# Patient Record
Sex: Female | Born: 1945 | Race: White | Hispanic: No | Smoking: Never smoker
Health system: Southern US, Community
[De-identification: ages and names within clinical notes are randomized; demographics above are authoritative.]

## PROBLEM LIST (undated history)

## (undated) DIAGNOSIS — E7800 Pure hypercholesterolemia, unspecified: Secondary | ICD-10-CM

## (undated) DIAGNOSIS — M1990 Unspecified osteoarthritis, unspecified site: Secondary | ICD-10-CM

## (undated) DIAGNOSIS — I10 Essential (primary) hypertension: Secondary | ICD-10-CM

## (undated) HISTORY — DX: Unspecified osteoarthritis, unspecified site: M19.90

---

## 1999-01-17 ENCOUNTER — Ambulatory Visit (HOSPITAL_COMMUNITY): Admission: RE | Admit: 1999-01-17 | Discharge: 1999-01-17 | Payer: Self-pay | Admitting: *Deleted

## 1999-01-17 ENCOUNTER — Ambulatory Visit (HOSPITAL_COMMUNITY): Admission: RE | Admit: 1999-01-17 | Discharge: 1999-01-17 | Payer: Self-pay

## 2011-04-27 ENCOUNTER — Encounter: Payer: Self-pay | Admitting: *Deleted

## 2011-04-27 ENCOUNTER — Emergency Department (HOSPITAL_BASED_OUTPATIENT_CLINIC_OR_DEPARTMENT_OTHER)
Admission: EM | Admit: 2011-04-27 | Discharge: 2011-04-27 | Disposition: A | Discharge status: Home / Self Care | Payer: Medicare Other | Attending: Emergency Medicine | Admitting: Emergency Medicine

## 2011-04-27 DIAGNOSIS — E7800 Pure hypercholesterolemia, unspecified: Secondary | ICD-10-CM | POA: Insufficient documentation

## 2011-04-27 DIAGNOSIS — I10 Essential (primary) hypertension: Secondary | ICD-10-CM | POA: Insufficient documentation

## 2011-04-27 DIAGNOSIS — E119 Type 2 diabetes mellitus without complications: Secondary | ICD-10-CM | POA: Insufficient documentation

## 2011-04-27 DIAGNOSIS — K0889 Other specified disorders of teeth and supporting structures: Secondary | ICD-10-CM

## 2011-04-27 DIAGNOSIS — K089 Disorder of teeth and supporting structures, unspecified: Secondary | ICD-10-CM | POA: Insufficient documentation

## 2011-04-27 HISTORY — DX: Essential (primary) hypertension: I10

## 2011-04-27 HISTORY — DX: Pure hypercholesterolemia, unspecified: E78.00

## 2011-04-27 MED ORDER — PENICILLIN V POTASSIUM 250 MG PO TABS: 250.0000 mg | ORAL_TABLET | Freq: Four times a day (QID) | ORAL | Status: AC

## 2011-04-27 NOTE — ED Provider Notes (Signed)
History     CSN: [id number] Arrival date & time: 04/27/2011  7:51 PM   Chief Complaint  Patient presents with  . Dental Pain     (Include location/radiation/quality/duration/timing/severity/associated sxs/prior treatment) HPI Comments: Pt states that she broke the tooth about 1 year ago and now she has been having worsening pain over the last 2 months  Patient is a 65 y.o. female presenting with tooth pain. The history is provided by the patient. No language interpreter was used.  Dental PainThe primary symptoms include mouth pain and dental injury. The symptoms began more than 1 month ago. The symptoms are worsening. The symptoms are recurrent.     Past Medical History  Diagnosis Date  . Diabetes mellitus   . Hypertension   . Hypercholesteremia      History reviewed. No pertinent past surgical history.  History reviewed. No pertinent family history.  History  Substance Use Topics  . Smoking status: Never Smoker   . Smokeless tobacco: Not on file  . Alcohol Use: No    OB History    Grav Para Term Preterm Abortions TAB SAB Ect Mult Living                  Review of Systems  All other systems reviewed and are negative.    Allergies  Review of patient's allergies indicates not on file.  Home Medications  No current outpatient prescriptions on file.  Physical Exam    BP 168/79  Pulse 62  Temp(Src) 98.7 F (37.1 C) (Oral)  Resp 20  Ht 5\' 7"  (1.702 m)  Wt 214 lb (97.07 kg)  BMI 33.52 kg/m2  SpO2 98%  Physical Exam  Nursing note and vitals reviewed. Constitutional: She appears well-developed and well-nourished.  HENT:  Head: Normocephalic.  Right Ear: External ear normal.  Left Ear: External ear normal.  Mouth/Throat: Oropharynx is clear and moist.    Cardiovascular: Normal rate and regular rhythm.     ED Course  Procedures  No results found for this or any previous visit. No results found.   No diagnosis found.   MDM Will treat for  dental infection       Teressa Lower, NP 04/27/11 2021  Medical screening examination/treatment/procedure(s) were performed by non-physician practitioner and as supervising physician I was immediately available for consultation/collaboration.  Olivia Mackie, MD 04/28/11 [phone]

## 2011-04-27 NOTE — ED Notes (Signed)
Pt describes dental pain (left lower) x 2 months.

## 2013-06-18 ENCOUNTER — Emergency Department (HOSPITAL_BASED_OUTPATIENT_CLINIC_OR_DEPARTMENT_OTHER): Payer: Medicare Other

## 2013-06-18 ENCOUNTER — Encounter (HOSPITAL_BASED_OUTPATIENT_CLINIC_OR_DEPARTMENT_OTHER): Payer: Self-pay | Admitting: Emergency Medicine

## 2013-06-18 ENCOUNTER — Emergency Department (HOSPITAL_BASED_OUTPATIENT_CLINIC_OR_DEPARTMENT_OTHER)
Admission: EM | Admit: 2013-06-18 | Discharge: 2013-06-18 | Disposition: A | Discharge status: Home / Self Care | Payer: Medicare Other | Attending: Emergency Medicine | Admitting: Emergency Medicine

## 2013-06-18 DIAGNOSIS — E119 Type 2 diabetes mellitus without complications: Secondary | ICD-10-CM | POA: Insufficient documentation

## 2013-06-18 DIAGNOSIS — I10 Essential (primary) hypertension: Secondary | ICD-10-CM | POA: Insufficient documentation

## 2013-06-18 DIAGNOSIS — S52599A Other fractures of lower end of unspecified radius, initial encounter for closed fracture: Secondary | ICD-10-CM | POA: Insufficient documentation

## 2013-06-18 DIAGNOSIS — Z79899 Other long term (current) drug therapy: Secondary | ICD-10-CM | POA: Insufficient documentation

## 2013-06-18 DIAGNOSIS — Y939 Activity, unspecified: Secondary | ICD-10-CM | POA: Insufficient documentation

## 2013-06-18 DIAGNOSIS — W010XXA Fall on same level from slipping, tripping and stumbling without subsequent striking against object, initial encounter: Secondary | ICD-10-CM | POA: Insufficient documentation

## 2013-06-18 DIAGNOSIS — Y929 Unspecified place or not applicable: Secondary | ICD-10-CM | POA: Insufficient documentation

## 2013-06-18 DIAGNOSIS — E7800 Pure hypercholesterolemia, unspecified: Secondary | ICD-10-CM | POA: Insufficient documentation

## 2013-06-18 DIAGNOSIS — Z794 Long term (current) use of insulin: Secondary | ICD-10-CM | POA: Insufficient documentation

## 2013-06-18 DIAGNOSIS — S52502A Unspecified fracture of the lower end of left radius, initial encounter for closed fracture: Secondary | ICD-10-CM

## 2013-06-18 NOTE — ED Provider Notes (Signed)
CSN: [id number]     Arrival date & time 06/18/13  1227 History   First MD Initiated Contact with Patient 06/18/13 1359     Chief Complaint  Patient presents with  . Fall   (Consider location/radiation/quality/duration/timing/severity/associated sxs/prior Treatment) HPI Comments: Patient is a 67 year old female who presents to the ED with left wrist and hand pain that started yesterday after a FOOSH. Patient reports a mechanical fall where she tripped and braced her fall with her left hand. Patient reports sudden onset of throbbing pain that does not radiate. Movement of the hand and wrist makes the pain worse. No alleviating factors. Patient applied ice to the injury and took ibuprofen which provided minimal pain relief. Patient denies any head injury or LOC. Patient denies any other injury.   Patient is a 67 y.o. female presenting with fall.  Fall Associated symptoms include arthralgias and joint swelling.    Past Medical History  Diagnosis Date  . Diabetes mellitus   . Hypertension   . Hypercholesteremia    No past surgical history on file. No family history on file. History  Substance Use Topics  . Smoking status: Never Smoker   . Smokeless tobacco: Not on file  . Alcohol Use: No   OB History   Grav Para Term Preterm Abortions TAB SAB Ect Mult Living                 Review of Systems  Musculoskeletal: Positive for arthralgias and joint swelling.  All other systems reviewed and are negative.    Allergies  Review of patient's allergies indicates no known allergies.  Home Medications   Current Outpatient Rx  Name  Route  Sig  Dispense  Refill  . amLODipine (NORVASC) 10 MG tablet   Oral   Take 10 mg by mouth daily.           Marland Kitchen gabapentin (NEURONTIN) 100 MG capsule   Oral   Take 100 mg by mouth 3 (three) times daily.           . insulin lispro protamine-insulin lispro (HUMALOG 50/50) (50-50) 100 UNIT/ML SUSP   Subcutaneous   Inject into the skin 2 (two)  times daily before a meal.           . metoprolol (TOPROL-XL) 100 MG 24 hr tablet   Oral   Take 100 mg by mouth daily.           . rosuvastatin (CRESTOR) 10 MG tablet   Oral   Take 10 mg by mouth daily.           . sitaGLIPtin (JANUVIA) 100 MG tablet   Oral   Take 100 mg by mouth daily.            BP 133/66  Pulse 71  Temp(Src) 98.8 F (37.1 C) (Oral)  Resp 18  Ht 5\' 6"  (1.676 m)  Wt 212 lb (96.163 kg)  BMI 34.23 kg/m2  SpO2 100% Physical Exam  Nursing note and vitals reviewed. Constitutional: She is oriented to person, place, and time. She appears well-developed and well-nourished. No distress.  HENT:  Head: Normocephalic and atraumatic.  Eyes: Conjunctivae are normal.  Neck: Normal range of motion.  Cardiovascular: Normal rate, regular rhythm and intact distal pulses.  Exam reveals no gallop and no friction rub.   No murmur heard. Sufficient capillary refill of left hand phalanges.   Pulmonary/Chest: Effort normal and breath sounds normal. She has no wheezes. She has no rales.  She exhibits no tenderness.  Musculoskeletal: Normal range of motion.  Generalized edema of left wrist and hand. Tenderness to palpation of left snuff box. Mild tenderness to dorsal left wrist. No obvious deformity. ROM of left hand and wrist limited due to edema and pain.   Neurological: She is alert and oriented to person, place, and time. Coordination normal.  Speech is goal-oriented. Moves limbs without ataxia.   Skin: Skin is warm and dry.  Psychiatric: She has a normal mood and affect. Her behavior is normal.    ED Course  Procedures (including critical care time)  SPLINT APPLICATION Date/Time: 12/17/2012 3:38 PM Authorized by: Emilia Beck Consent: Verbal consent obtained. Risks and benefits: risks, benefits and alternatives were discussed Consent given by: patient Splint applied by: orthopedic technician Location details: left wrist Splint type: thumb spica Supplies  used: fiberglass Post-procedure: The splinted body part was neurovascularly unchanged following the procedure. Patient tolerance: Patient tolerated the procedure well with no immediate complications.   Labs Review Labs Reviewed - No data to display Imaging Review Dg Wrist Complete Left  06/18/2013   CLINICAL DATA:  Patient fell yesterday injuring her left hand and left wrist. Posterior pain with swelling.  EXAM: LEFT WRIST - COMPLETE 3+ VIEW; LEFT HAND - COMPLETE 3+ VIEW  COMPARISON:  None.  FINDINGS: Left hand: The left hand demonstrates no fracture or dislocation. There is severe osteopenia. There are degenerative changes throughout the DIP joints. There is no soft tissue swelling.  Left wrist: There is a subtle lucency along the volar aspect of the distal radial metaphysis which may represent a Mach line versus a nondisplaced fracture. There is no other fracture or dislocation. There are mild degenerative changes of the triscaphe joint. There is severe osteopenia. There is soft tissue swelling around the left wrist.  IMPRESSION: 1. There is a subtle lucency along the volar aspect of the distal radial metaphysis which may represent a Mach line versus a nondisplaced fracture. Correlate with point tenderness.  2.  No acute osseous injury of the left hand.  If there is clinical concern regarding a radiographically occult fracture, snuff box tenderness or ligamentous injury, then further assessment with MRI is recommended.   Electronically Signed   By: Elige Ko   On: 06/18/2013 13:18   Dg Hand Complete Left  06/18/2013   CLINICAL DATA:  Patient fell yesterday injuring her left hand and left wrist. Posterior pain with swelling.  EXAM: LEFT WRIST - COMPLETE 3+ VIEW; LEFT HAND - COMPLETE 3+ VIEW  COMPARISON:  None.  FINDINGS: Left hand: The left hand demonstrates no fracture or dislocation. There is severe osteopenia. There are degenerative changes throughout the DIP joints. There is no soft tissue  swelling.  Left wrist: There is a subtle lucency along the volar aspect of the distal radial metaphysis which may represent a Mach line versus a nondisplaced fracture. There is no other fracture or dislocation. There are mild degenerative changes of the triscaphe joint. There is severe osteopenia. There is soft tissue swelling around the left wrist.  IMPRESSION: 1. There is a subtle lucency along the volar aspect of the distal radial metaphysis which may represent a Mach line versus a nondisplaced fracture. Correlate with point tenderness.  2.  No acute osseous injury of the left hand.  If there is clinical concern regarding a radiographically occult fracture, snuff box tenderness or ligamentous injury, then further assessment with MRI is recommended.   Electronically Signed   By: Elige Ko  On: 06/18/2013 13:18    EKG Interpretation   None       MDM   1. Distal radial fracture, left, closed, initial encounter     2:13 PM Xray shows possible nondisplaced fracture of distal radius. A fracture correlates clinically so patient will have a left wrist splint and be instructed to follow up with a Hydrographic surveyor. No neurovascular compromise. Patient has snuff box tenderness on exam indicating possible occult scaphoid fracture. Vitals stable and patient afebrile. Patient declines pain medication at this time.    Emilia Beck, [place] 06/18/13 2338

## 2013-06-18 NOTE — ED Notes (Signed)
Pt lost her footing and fell outside yesterday.  Pt injured left hand/wrist.  Noted swelling to same.

## 2013-06-20 NOTE — ED Provider Notes (Signed)
EKG Interpretation   None        Derwood Kaplan, MD 06/20/13 2311

## 2014-04-10 ENCOUNTER — Non-Acute Institutional Stay (SKILLED_NURSING_FACILITY): Payer: Medicare HMO | Admitting: Adult Health

## 2014-04-10 ENCOUNTER — Encounter: Payer: Self-pay | Admitting: Adult Health

## 2014-04-10 DIAGNOSIS — G589 Mononeuropathy, unspecified: Secondary | ICD-10-CM

## 2014-04-10 DIAGNOSIS — M1710 Unilateral primary osteoarthritis, unspecified knee: Secondary | ICD-10-CM

## 2014-04-10 DIAGNOSIS — I10 Essential (primary) hypertension: Secondary | ICD-10-CM | POA: Insufficient documentation

## 2014-04-10 DIAGNOSIS — K219 Gastro-esophageal reflux disease without esophagitis: Secondary | ICD-10-CM | POA: Insufficient documentation

## 2014-04-10 DIAGNOSIS — IMO0001 Reserved for inherently not codable concepts without codable children: Secondary | ICD-10-CM | POA: Insufficient documentation

## 2014-04-10 DIAGNOSIS — E785 Hyperlipidemia, unspecified: Secondary | ICD-10-CM

## 2014-04-10 DIAGNOSIS — M1711 Unilateral primary osteoarthritis, right knee: Secondary | ICD-10-CM | POA: Insufficient documentation

## 2014-04-10 DIAGNOSIS — G629 Polyneuropathy, unspecified: Secondary | ICD-10-CM

## 2014-04-10 DIAGNOSIS — E1165 Type 2 diabetes mellitus with hyperglycemia: Secondary | ICD-10-CM

## 2014-04-10 NOTE — Progress Notes (Signed)
Patient ID: Anna Griffith, female   DOB: 04-Aug-1946, 68 y.o.   MRN: [id number]               PROGRESS NOTE  DATE: 04/10/2014  FACILITY: Nursing Home Location: [facility]  LEVEL OF CARE: SNF (31)  Acute Visit  CHIEF COMPLAINT:  Follow-up Hospitalization  HISTORY OF PRESENT ILLNESS: This is a 68 year old female who has been admitted to [facility] on 04/07/14 from [facility] with Osteoarthritis S/P right total knee arthroplasty. She has been admitted for a short-term rehabilitation.   REASSESSMENT OF ONGOING PROBLEM(S):  DM:pt's DM  Is unstable.  Pt denies polyuria, polydipsia, polyphagia, changes in vision or hypoglycemic episodes.  CBG logs: 295, 279, 266, 243, 320  HTN: Pt 's HTN remains stable.  Denies CP, sob, DOE, headaches, dizziness or visual disturbances.  No complications from the medications currently being used.  Last BP : 126/58  GERD: pt's GERD is stable.  Denies ongoing heartburn, abd. Pain, nausea or vomiting.  Currently on a PPI & tolerates it without any adverse reactions.  PAST MEDICAL HISTORY : Reviewed.  No changes/see problem list  CURRENT MEDICATIONS: Reviewed per MAR/see medication list  REVIEW OF SYSTEMS:  GENERAL: no change in appetite, no fatigue, no weight changes, no fever, chills or weakness RESPIRATORY: no cough, SOB, DOE, wheezing, hemoptysis CARDIAC: no chest pain, or palpitations, +edema GI: no abdominal pain, diarrhea, constipation, heart burn, nausea or vomiting  PHYSICAL EXAMINATION  GENERAL: no acute distress, obese body habitus EYES: conjunctivae normal, sclerae normal, normal eye lids NECK: supple, trachea midline, no neck masses, no thyroid tenderness, no thyromegaly LYMPHATICS: no LAN in the neck, no supraclavicular LAN RESPIRATORY: breathing is even & unlabored, BS CTAB CARDIAC: RRR, no murmur,no extra heart sounds, RLE edema  2+ GI: abdomen soft, normal BS, no masses, no tenderness, no  hepatomegaly, no splenomegaly EXTREMITIES:  Able to move all 4 extremities with limited ROM on RLE due to surgery PSYCHIATRIC: the patient is alert & oriented to person, affect & behavior appropriate  LABS/RADIOLOGY: 04/05/14  sodium 145 potassium 4.3 glucose 93 BUN 10 creatinine 0.85 calcium 8.0 anion gap 7.0 WBC 6.4 hemoglobin 9.5 hematocrit 30.2 MCV 84.8   ASSESSMENT/PLAN:  Osteoarthritis is status post right total knee arthroplasty - for rehabilitation Diabetes mellitus, type II - continue metformin and start Humalog 75/25 38 units subcutaneous every morning and 32 units subcutaneous every afternoon GERD - stable; continue ranitidine Hyperlipidemia - continue Crestor Hypertension - well controlled; continue amlodipine, hydralazine, losartan and metoprolol Neuropathy - continue gabapentin and Lyrica    CPT CODE: 57846  Ella Bodo - NP [facility] [phone]

## 2014-04-11 ENCOUNTER — Non-Acute Institutional Stay (SKILLED_NURSING_FACILITY): Payer: Medicare HMO | Admitting: Internal Medicine

## 2014-04-11 DIAGNOSIS — G589 Mononeuropathy, unspecified: Secondary | ICD-10-CM

## 2014-04-11 DIAGNOSIS — I10 Essential (primary) hypertension: Secondary | ICD-10-CM

## 2014-04-11 DIAGNOSIS — E1149 Type 2 diabetes mellitus with other diabetic neurological complication: Secondary | ICD-10-CM

## 2014-04-11 DIAGNOSIS — M1710 Unilateral primary osteoarthritis, unspecified knee: Secondary | ICD-10-CM

## 2014-04-11 DIAGNOSIS — G629 Polyneuropathy, unspecified: Secondary | ICD-10-CM

## 2014-04-11 DIAGNOSIS — M1711 Unilateral primary osteoarthritis, right knee: Secondary | ICD-10-CM

## 2014-04-11 NOTE — Progress Notes (Signed)
        HISTORY & PHYSICAL  DATE: 04/11/2014   FACILITY: [facility]  LEVEL OF CARE: SNF (31)  ALLERGIES:  No Known Allergies  CHIEF COMPLAINT:  Manage right knee osteoarthritis, diabetes mellitus and hypertension  HISTORY OF PRESENT ILLNESS: Patient is a 68 year old African American female.  KNEE OSTEOARTHRITIS: Patient had a history of pain and functional disability in the knee due to end-stage osteoarthritis and has failed nonsurgical conservative treatments. Patient had worsening of pain with activity and weight bearing, pain that interfered with activities of daily living & pain with passive range of motion. Therefore patient underwent total knee arthroplasty and tolerated the procedure well. Patient is admitted to this facility for sort short-term rehabilitation. Patient denies knee pain.  HTN: Pt 's HTN remains stable.  Denies CP, sob, DOE, pedal edema, headaches, dizziness or visual disturbances.  No complications from the medications currently being used.  Last BP : 140/59.  DM:pt's DM remains stable.  Pt denies polyuria, polydipsia, polyphagia, changes in vision or hypoglycemic episodes.  No complications noted from the medication presently being used.  Last hemoglobin A1c is: Not available.  PAST MEDICAL HISTORY :  Past Medical History  Diagnosis Date  . Diabetes mellitus   . Hypertension   . Hypercholesteremia    PAST SURGICAL HISTORY: None  SOCIAL HISTORY:  reports that she has never smoked. She does not have any smokeless tobacco history on file. She reports that she does not drink alcohol or use illicit drugs.  FAMILY HISTORY:  CAD, diabetes mellitus and cancer  CURRENT MEDICATIONS: Reviewed per MAR/see medication list  REVIEW OF SYSTEMS:  See HPI otherwise 14 point ROS is negative.  PHYSICAL EXAMINATION  VS:  See VS section  GENERAL: no acute distress, obese body habitus EYES: conjunctivae normal, sclerae normal, normal eye  lids MOUTH/THROAT: lips without lesions,no lesions in the mouth,tongue is without lesions,uvula elevates in midline NECK: supple, trachea midline, no neck masses, no thyroid tenderness, no thyromegaly LYMPHATICS: no LAN in the neck, no supraclavicular LAN RESPIRATORY: breathing is even & unlabored, BS CTAB CARDIAC: RRR, no murmur,no extra heart sounds, no edema GI:  ABDOMEN: abdomen soft, normal BS, no masses, no tenderness  LIVER/SPLEEN: no hepatomegaly, no splenomegaly MUSCULOSKELETAL: HEAD: normal to inspection  EXTREMITIES: LEFT UPPER EXTREMITY: full range of motion, normal strength & tone RIGHT UPPER EXTREMITY:  full range of motion, normal strength & tone LEFT LOWER EXTREMITY: Moderate range of motion, normal strength & tone RIGHT LOWER EXTREMITY:  range of motion not tested due to surgery, normal strength & tone PSYCHIATRIC: the patient is alert & oriented to person, affect & behavior appropriate  LABS/RADIOLOGY:  04-06-14 hemoglobin 10.7, MCV 84 otherwise CBC normal, BMP normal, right knee x-ray-end-stage osteoarthritis   ASSESSMENT/PLAN:  Right knee osteoarthritis-status post total knee arthroplasty. Continue rehabilitation. Hypertension-blood pressure borderline. Will monitor. Diabetes mellitus-continue current medications Neuropathy-continue Neurontin Acute blood loss anemia-check CBC  I have reviewed patient's medical records received at admission/from hospitalization.  CPT CODE: 09811  Angela Cox, MD [facility] [phone]

## 2014-04-15 ENCOUNTER — Encounter: Payer: Self-pay | Admitting: Adult Health

## 2014-04-15 ENCOUNTER — Non-Acute Institutional Stay (SKILLED_NURSING_FACILITY): Payer: Medicare HMO | Admitting: Adult Health

## 2014-04-15 DIAGNOSIS — K219 Gastro-esophageal reflux disease without esophagitis: Secondary | ICD-10-CM

## 2014-04-15 DIAGNOSIS — M1711 Unilateral primary osteoarthritis, right knee: Secondary | ICD-10-CM

## 2014-04-15 DIAGNOSIS — E1149 Type 2 diabetes mellitus with other diabetic neurological complication: Secondary | ICD-10-CM

## 2014-04-15 DIAGNOSIS — M1710 Unilateral primary osteoarthritis, unspecified knee: Secondary | ICD-10-CM

## 2014-04-15 DIAGNOSIS — I10 Essential (primary) hypertension: Secondary | ICD-10-CM

## 2014-04-15 DIAGNOSIS — G589 Mononeuropathy, unspecified: Secondary | ICD-10-CM

## 2014-04-15 DIAGNOSIS — D62 Acute posthemorrhagic anemia: Secondary | ICD-10-CM

## 2014-04-15 DIAGNOSIS — G629 Polyneuropathy, unspecified: Secondary | ICD-10-CM

## 2014-04-15 DIAGNOSIS — E785 Hyperlipidemia, unspecified: Secondary | ICD-10-CM

## 2014-04-15 NOTE — Progress Notes (Signed)
Patient ID: Anna Griffith, female   DOB: [birth year], 68 y.o.   MRN: [id number]             PROGRESS NOTE  DATE:  04/15/14   FACILITY: Nursing Home Location: [facility]  LEVEL OF CARE: SNF (31)  Acute Visit  CHIEF COMPLAINT:  Discharge Notes  HISTORY OF PRESENT ILLNESS: This is a 68 year old female who is for discharge home with Home health PT, OT and Nursing. DME: Rolling walker. She has been admitted to [facility] on 04/07/14 from [facility] with Osteoarthritis S/P right total knee arthroplasty. Patient was admitted to this facility for short-term rehabilitation after the patient's recent hospitalization.  Patient has completed SNF rehabilitation and therapy has cleared the patient for discharge.  REASSESSMENT OF ONGOING PROBLEM(S):  HTN: Pt 's HTN remains stable.  Denies CP, sob, DOE, headaches, dizziness or visual disturbances.  No complications from the medications currently being used.  Last BP : 128/68  PERIPHERAL NEUROPATHY: The peripheral neuropathy is stable. The patient denies pain in the feet, tingling, and numbness. No complications noted from the medication presently being used.  HYPERLIPIDEMIA: No complications from the medications presently being used. Last fasting lipid panel showed : not available  PAST MEDICAL HISTORY : Reviewed.  No changes/see problem list  CURRENT MEDICATIONS: Reviewed per MAR/see medication list  REVIEW OF SYSTEMS:  GENERAL: no change in appetite, no fatigue, no weight changes, no fever, chills or weakness RESPIRATORY: no cough, SOB, DOE, wheezing, hemoptysis CARDIAC: no chest pain, or palpitations, +edema GI: no abdominal pain, diarrhea, constipation, heart burn, nausea or vomiting  PHYSICAL EXAMINATION  GENERAL: no acute distress, obese body habitus NECK: supple, trachea midline, no neck masses, no thyroid tenderness, no thyromegaly LYMPHATICS: no LAN in the neck, no supraclavicular LAN RESPIRATORY:  breathing is even & unlabored, BS CTAB CARDIAC: RRR, no murmur,no extra heart sounds, RLE edema  2+ GI: abdomen soft, normal BS, no masses, no tenderness, no hepatomegaly, no splenomegaly EXTREMITIES:  Able to move all 4 extremities  PSYCHIATRIC: the patient is alert & oriented to person, affect & behavior appropriate  LABS/RADIOLOGY: 04/12/14  WBC 8.2 hemoglobin 9.3 hematocrit 31.1 MCV 85.2 04/05/14  sodium 145 potassium 4.3 glucose 93 BUN 10 creatinine 0.85 calcium 8.0 anion gap 7.0 WBC 6.4 hemoglobin 9.5 hematocrit 30.2 MCV 84.8   ASSESSMENT/PLAN:  Osteoarthritis is status post right total knee arthroplasty - for rehabilitation Diabetes mellitus, type II - continue metformin and decreased Humalog 75/25 10 units SQ BID GERD - stable; continue ranitidine Hyperlipidemia - continue Crestor Hypertension - well controlled; continue amlodipine, hydralazine, losartan and metoprolol Neuropathy - continue gabapentin and Lyrica Anemia, acute blood loss - continue FeSO4  I have filled out patient's discharge paperwork and written prescriptions.  Patient will receive home health PT, OT and Nursing.  DME provided: Rolling walker  Total discharge time: Greater than 30 minutes  Discharge time involved coordination of the discharge process with Child psychotherapist, nursing staff and therapy department. Medical justification for home health services/DME verified.   CPT CODE: 40981  Ella Bodo - NP [facility] [phone]

## 2014-04-22 DIAGNOSIS — E1149 Type 2 diabetes mellitus with other diabetic neurological complication: Secondary | ICD-10-CM

## 2014-04-22 DIAGNOSIS — R262 Difficulty in walking, not elsewhere classified: Secondary | ICD-10-CM

## 2014-04-22 DIAGNOSIS — Z471 Aftercare following joint replacement surgery: Secondary | ICD-10-CM

## 2014-04-22 DIAGNOSIS — I10 Essential (primary) hypertension: Secondary | ICD-10-CM

## 2014-04-28 ENCOUNTER — Encounter: Payer: Self-pay | Admitting: *Deleted

## 2014-12-17 IMAGING — CR DG HAND COMPLETE 3+V*L*
3 series · 3 of 3 positions shown · non-contrast
Comparison: None.

CLINICAL DATA: Patient fell yesterday injuring her left hand and
left wrist. Posterior pain with swelling.

EXAM:
LEFT WRIST - COMPLETE 3+ VIEW; LEFT HAND - COMPLETE 3+ VIEW

[x hand pa left]
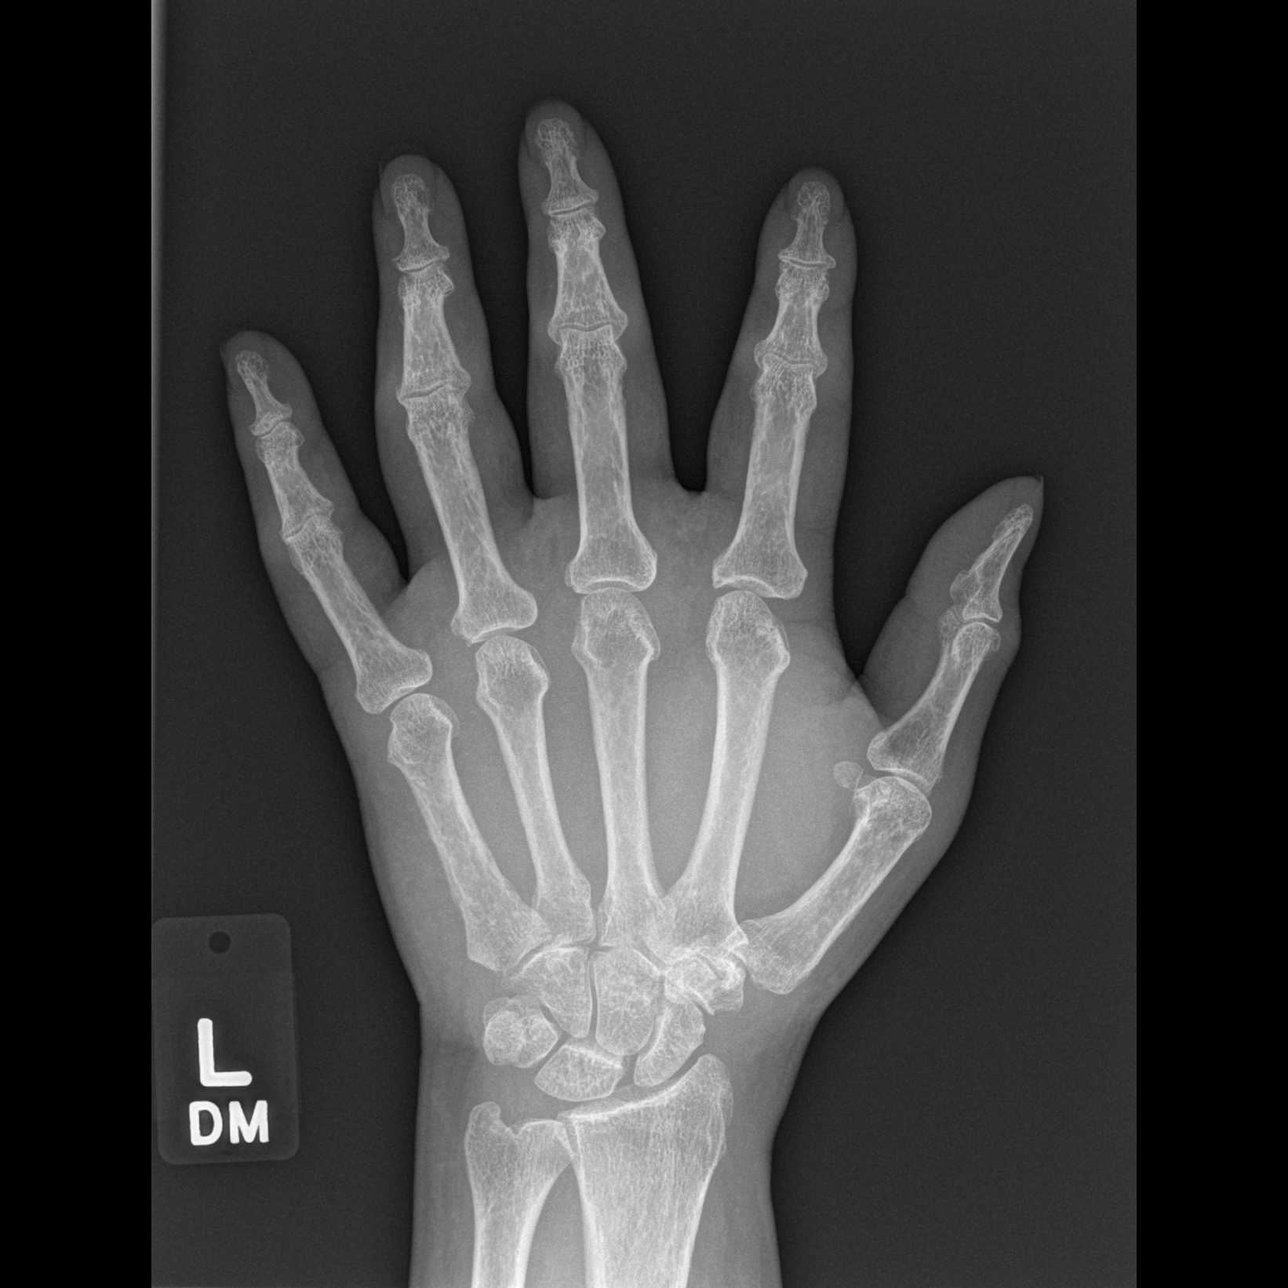

[x hand oblique left]
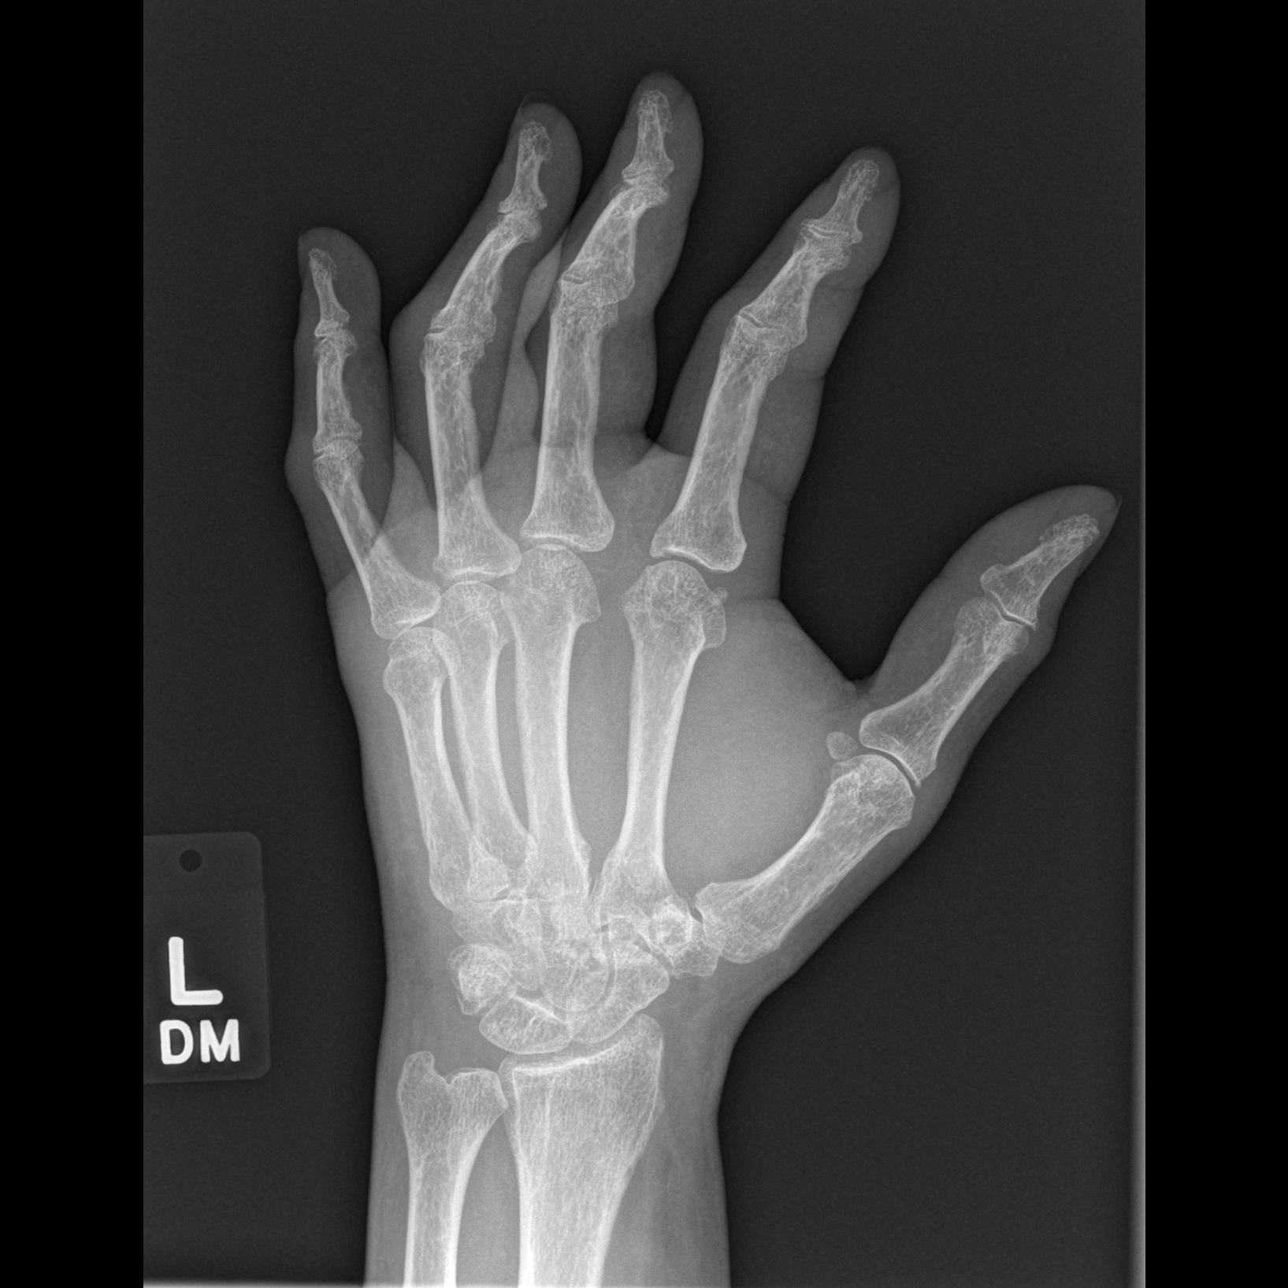

[x hand lat left]
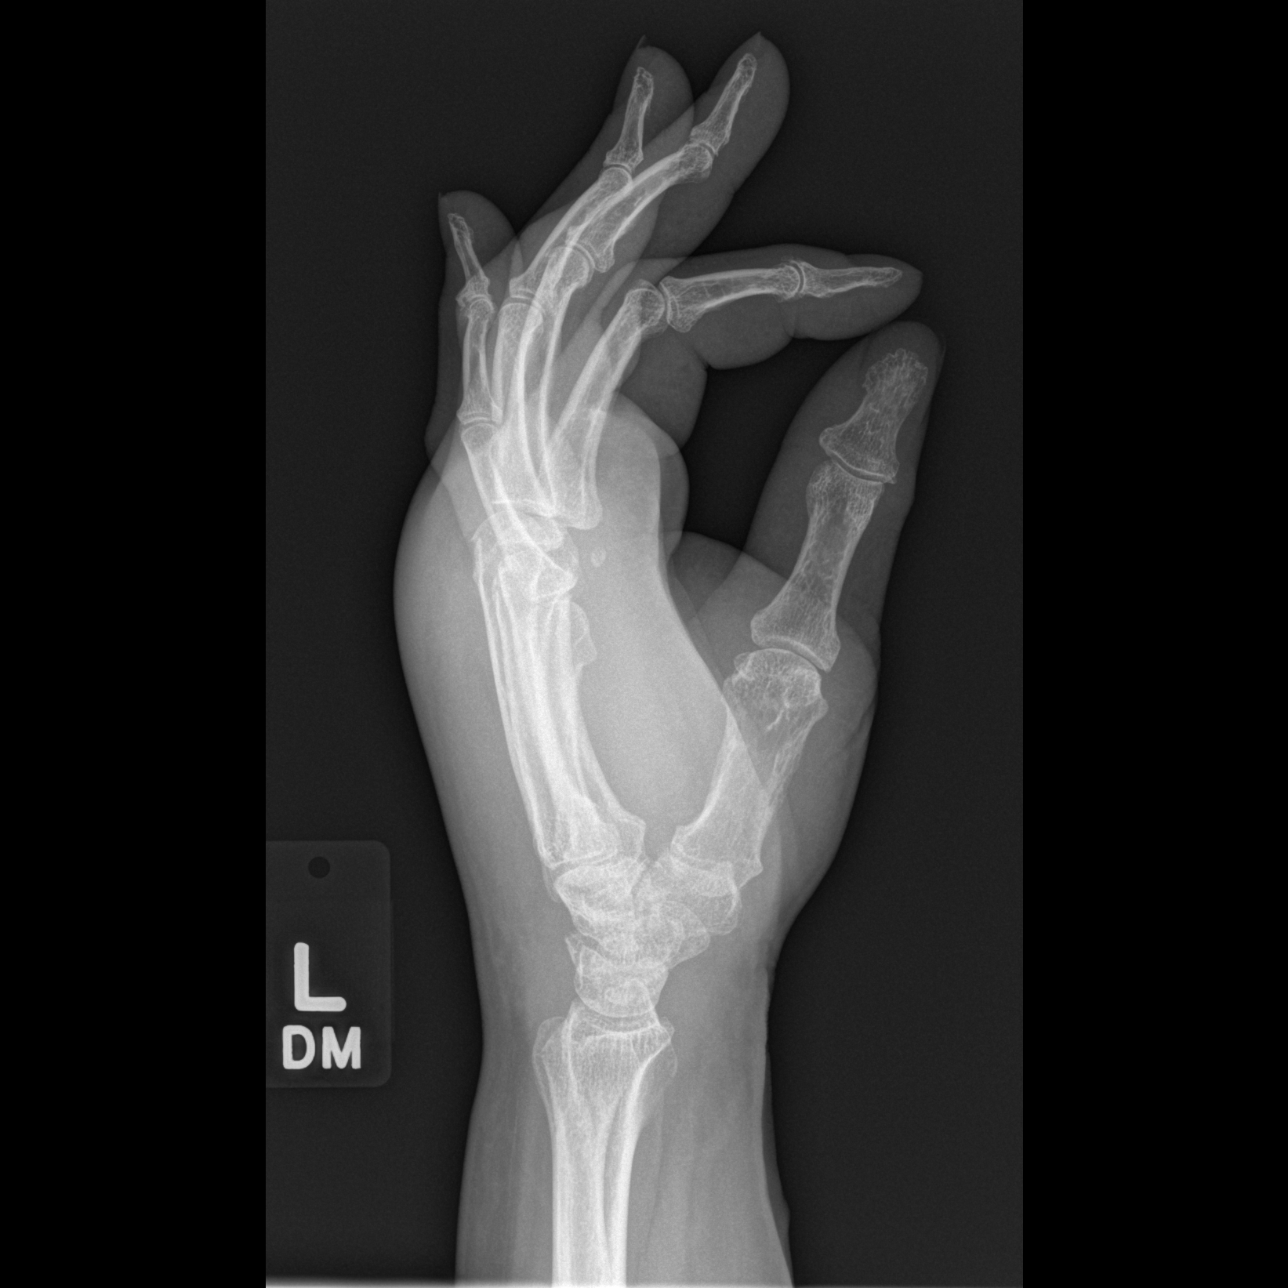

[3 of 3 positions shown; findings below may reference images not displayed]

FINDINGS: Left hand: The left hand demonstrates no fracture or dislocation.
There is severe osteopenia. There are degenerative changes
throughout the DIP joints. There is no soft tissue swelling.

Left wrist: There is a subtle lucency along the volar aspect of the
distal radial metaphysis which may represent a Mach line versus a
nondisplaced fracture. There is no other fracture or dislocation.
There are mild degenerative changes of the triscaphe joint. There is
severe osteopenia. There is soft tissue swelling around the left
wrist.
IMPRESSION: 1. There is a subtle lucency along the volar aspect of the distal
radial metaphysis which may represent a Mach line versus a
nondisplaced fracture. Correlate with point tenderness.

2.  No acute osseous injury of the left hand.

If there is clinical concern regarding a radiographically occult
fracture, snuff box tenderness or ligamentous injury, then further
assessment with MRI is recommended.

## 2014-12-17 IMAGING — CR DG WRIST COMPLETE 3+V*L*
4 series · 4 of 4 positions shown · non-contrast
Comparison: None.

CLINICAL DATA: Patient fell yesterday injuring her left hand and
left wrist. Posterior pain with swelling.

EXAM:
LEFT WRIST - COMPLETE 3+ VIEW; LEFT HAND - COMPLETE 3+ VIEW

[x wrist pa left]
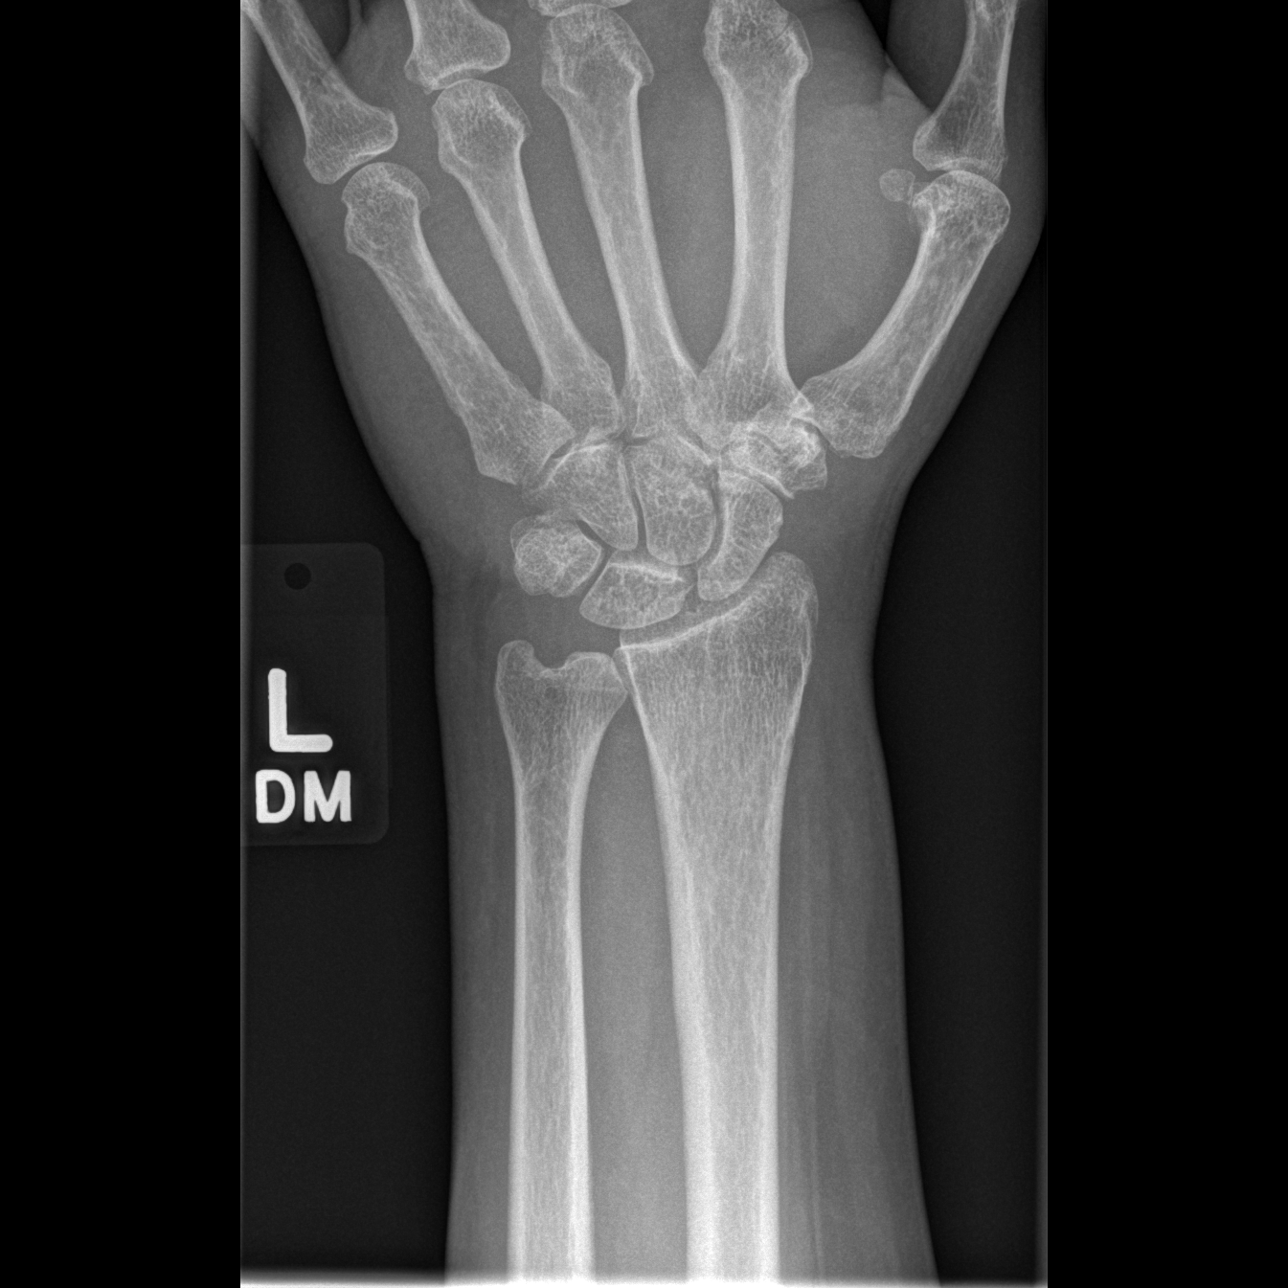

[x wrist obl left]
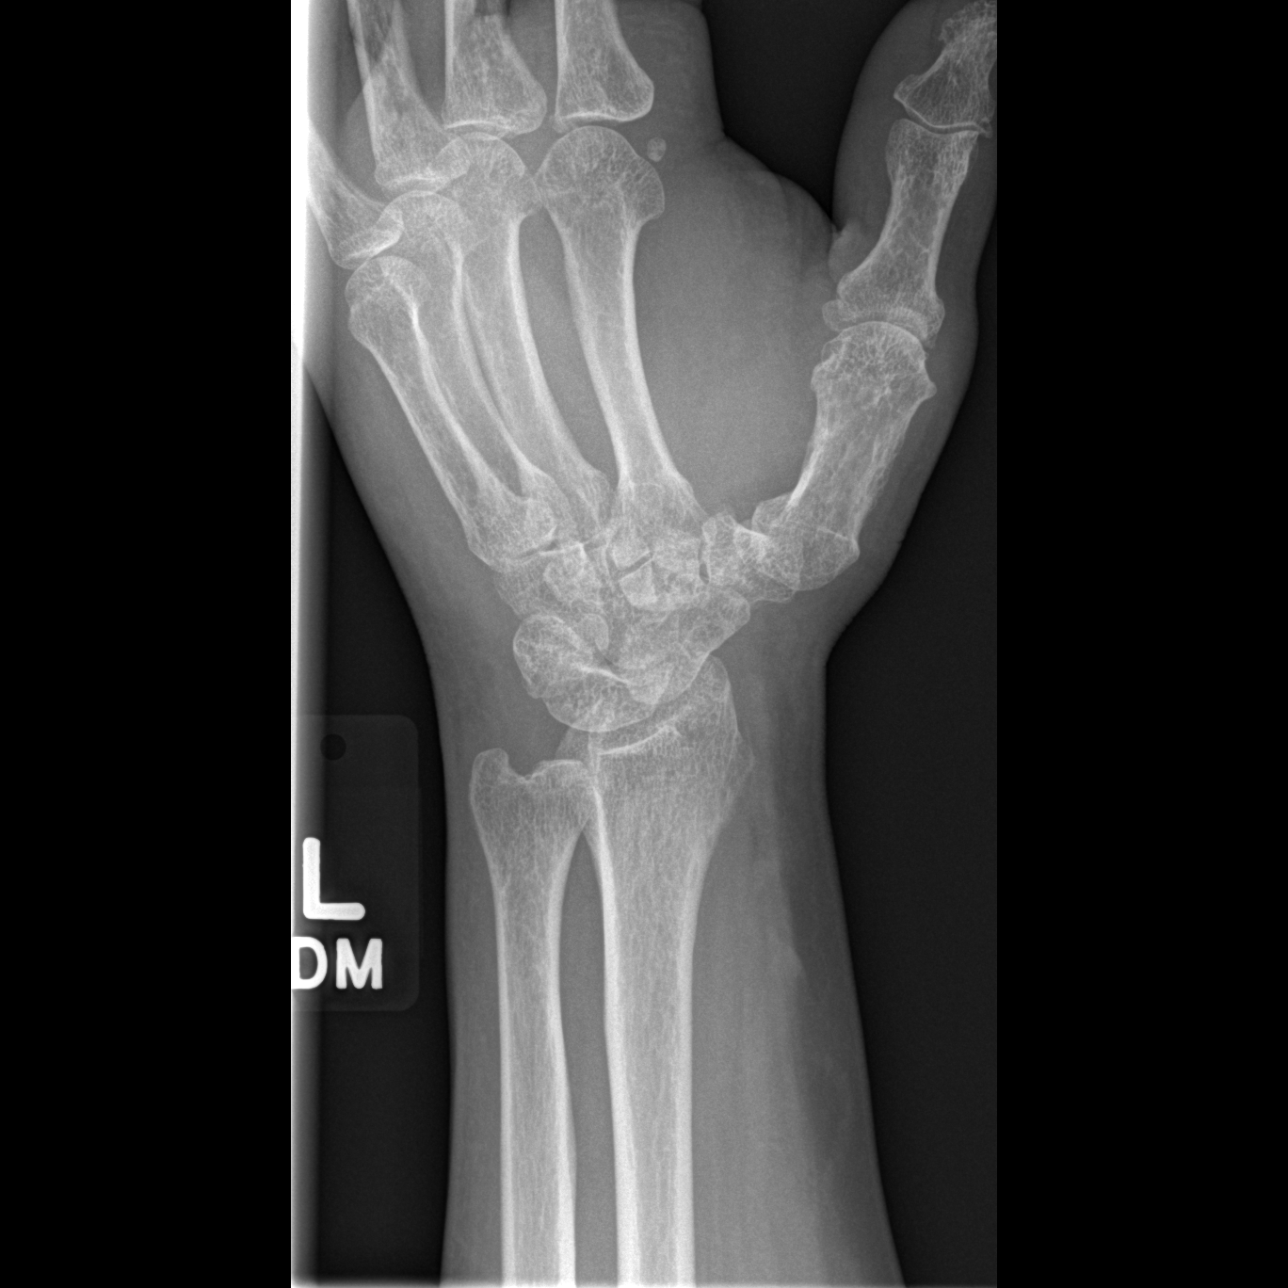

[x wrist lat left]
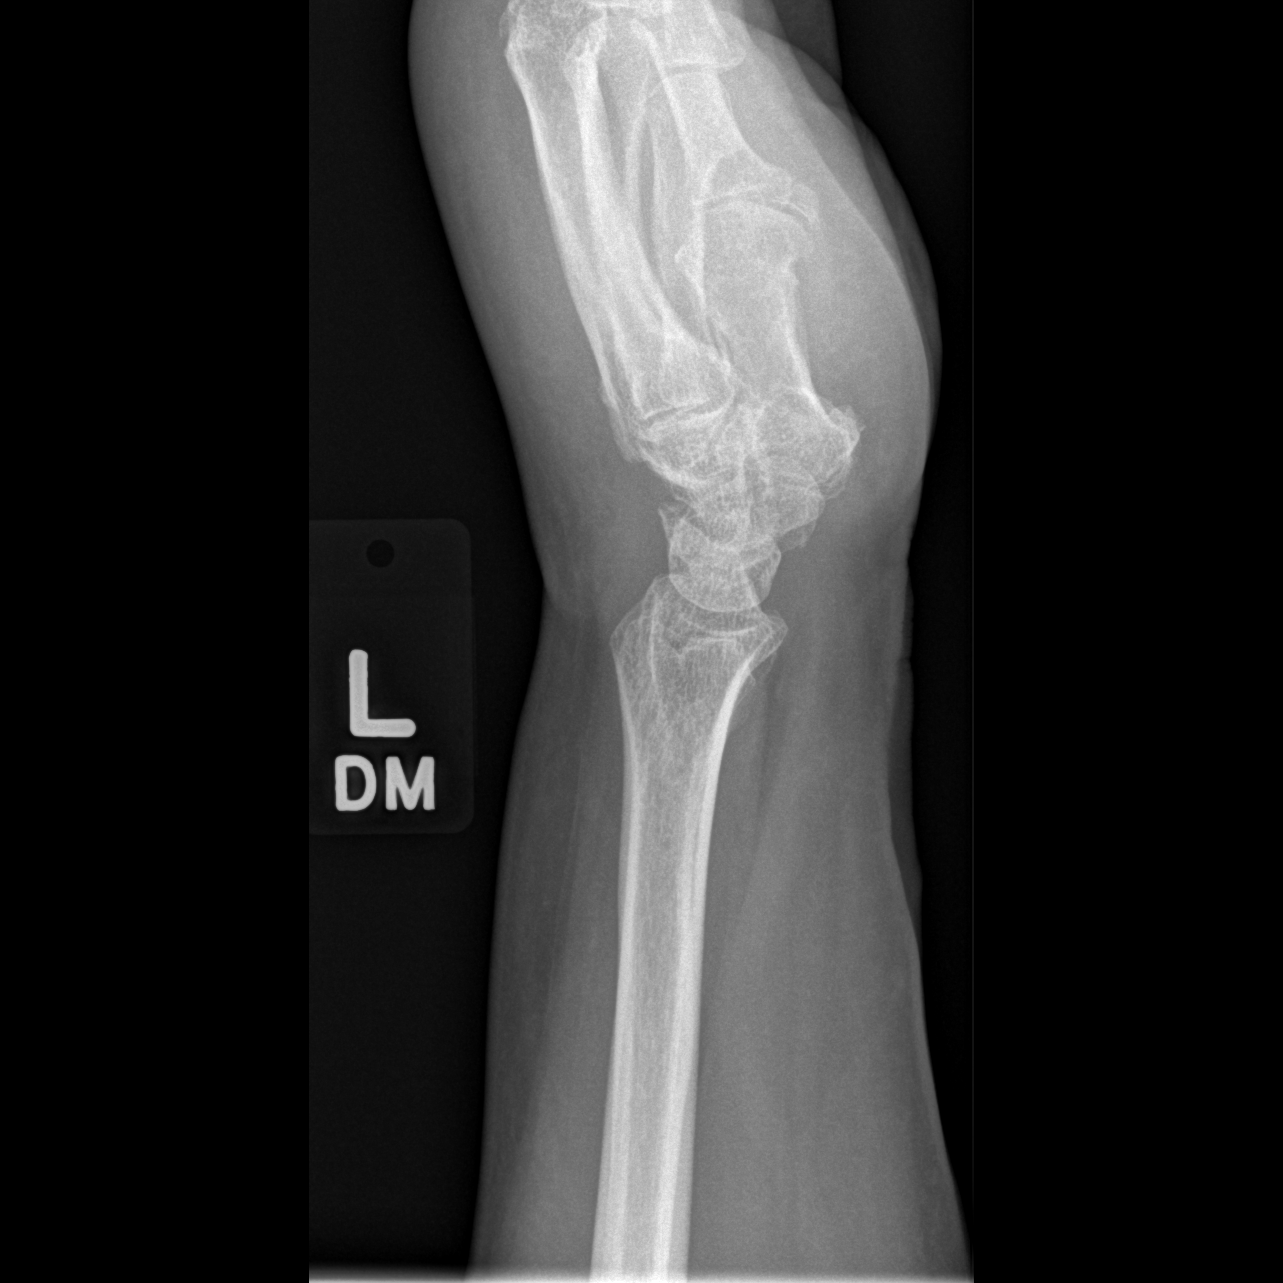

[x navicular]
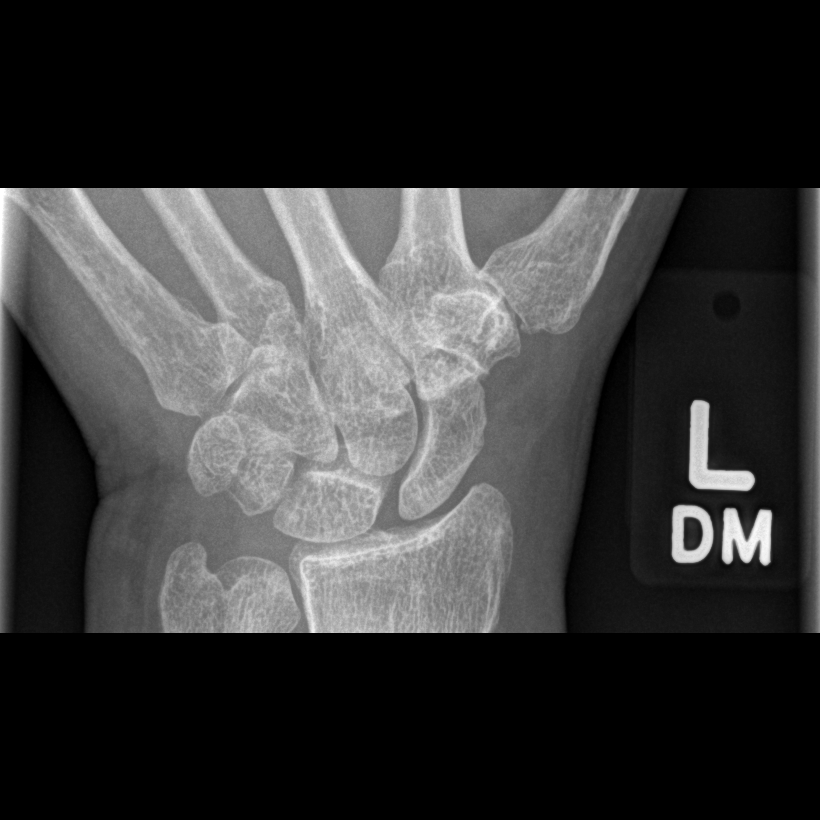

[4 of 4 positions shown; findings below may reference images not displayed]

FINDINGS: Left hand: The left hand demonstrates no fracture or dislocation.
There is severe osteopenia. There are degenerative changes
throughout the DIP joints. There is no soft tissue swelling.

Left wrist: There is a subtle lucency along the volar aspect of the
distal radial metaphysis which may represent a Mach line versus a
nondisplaced fracture. There is no other fracture or dislocation.
There are mild degenerative changes of the triscaphe joint. There is
severe osteopenia. There is soft tissue swelling around the left
wrist.
IMPRESSION: 1. There is a subtle lucency along the volar aspect of the distal
radial metaphysis which may represent a Mach line versus a
nondisplaced fracture. Correlate with point tenderness.

2.  No acute osseous injury of the left hand.

If there is clinical concern regarding a radiographically occult
fracture, snuff box tenderness or ligamentous injury, then further
assessment with MRI is recommended.

## 2019-10-15 ENCOUNTER — Ambulatory Visit: Payer: Medicare Other | Attending: Internal Medicine

## 2019-10-15 DIAGNOSIS — Z23 Encounter for immunization: Secondary | ICD-10-CM

## 2019-10-15 NOTE — Progress Notes (Signed)
   Covid-19 Vaccination Clinic  Name:  Anna Griffith    MRN: [id number] DOB: 1945-12-14  10/15/2019  [name] was observed post Covid-19 immunization for 15 minutes without incident. She was provided with Vaccine Information Sheet and instruction to access the V-Safe system.   [name] was instructed to call 911 with any severe reactions post vaccine: Marland Kitchen Difficulty breathing  . Swelling of face and throat  . A fast heartbeat  . A bad rash all over body  . Dizziness and weakness   Immunizations Administered    Name Date Dose VIS Date Route   Pfizer COVID-19 Vaccine 10/15/2019  9:39 AM 0.3 mL 07/22/2019 Intramuscular   Manufacturer: [employer], [employer]   Lot: [id number]   NDC: [address]-6

## 2019-11-05 ENCOUNTER — Ambulatory Visit: Payer: Medicare Other | Attending: Internal Medicine

## 2019-11-05 DIAGNOSIS — Z23 Encounter for immunization: Secondary | ICD-10-CM

## 2019-11-05 NOTE — Progress Notes (Signed)
   Covid-19 Vaccination Clinic  Name:  Natisha Trzcinski    MRN: [id number] DOB: 05/10/1946  11/05/2019  Ms. Cadet was observed post Covid-19 immunization for 15 minutes without incident. She was provided with Vaccine Information Sheet and instruction to access the V-Safe system.   Ms. Tapanes was instructed to call 911 with any severe reactions post vaccine: Marland Kitchen Difficulty breathing  . Swelling of face and throat  . A fast heartbeat  . A bad rash all over body  . Dizziness and weakness   Immunizations Administered    Name Date Dose VIS Date Route   Pfizer COVID-19 Vaccine 11/05/2019 10:21 AM 0.3 mL 07/22/2019 Intramuscular   Manufacturer: [employer], [employer]   Lot: [id number]   NDC: [address]-7
